# Patient Record
Sex: Female | Born: 1955 | Race: White | Hispanic: No | Marital: Married | Smoking: Never smoker
Health system: Southern US, Community
[De-identification: ages and names within clinical notes are randomized; demographics above are authoritative.]

## PROBLEM LIST (undated history)

## (undated) HISTORY — PX: BREAST EXCISIONAL BIOPSY: SUR124

---

## 2010-03-20 ENCOUNTER — Other Ambulatory Visit: Payer: Self-pay | Admitting: Family Medicine

## 2010-03-20 DIAGNOSIS — Z1231 Encounter for screening mammogram for malignant neoplasm of breast: Secondary | ICD-10-CM

## 2010-03-29 ENCOUNTER — Other Ambulatory Visit (HOSPITAL_COMMUNITY)
Admission: RE | Admit: 2010-03-29 | Discharge: 2010-03-29 | Disposition: A | Discharge status: Home / Self Care | Payer: Managed Care, Other (non HMO) | Source: Ambulatory Visit | Attending: Obstetrics and Gynecology | Admitting: Obstetrics and Gynecology

## 2010-03-29 DIAGNOSIS — Z01419 Encounter for gynecological examination (general) (routine) without abnormal findings: Secondary | ICD-10-CM | POA: Insufficient documentation

## 2010-06-05 ENCOUNTER — Ambulatory Visit
Admission: RE | Admit: 2010-06-05 | Discharge: 2010-06-05 | Disposition: A | Discharge status: Home / Self Care | Payer: Managed Care, Other (non HMO) | Source: Ambulatory Visit | Attending: Family Medicine | Admitting: Family Medicine

## 2010-06-05 DIAGNOSIS — Z1231 Encounter for screening mammogram for malignant neoplasm of breast: Secondary | ICD-10-CM

## 2011-03-11 ENCOUNTER — Other Ambulatory Visit: Payer: Self-pay | Admitting: Family Medicine

## 2011-03-11 DIAGNOSIS — Z1231 Encounter for screening mammogram for malignant neoplasm of breast: Secondary | ICD-10-CM

## 2011-04-03 ENCOUNTER — Other Ambulatory Visit (HOSPITAL_COMMUNITY)
Admission: RE | Admit: 2011-04-03 | Discharge: 2011-04-03 | Disposition: A | Discharge status: Home / Self Care | Payer: Managed Care, Other (non HMO) | Source: Ambulatory Visit | Attending: Obstetrics and Gynecology | Admitting: Obstetrics and Gynecology

## 2011-04-03 DIAGNOSIS — Z01419 Encounter for gynecological examination (general) (routine) without abnormal findings: Secondary | ICD-10-CM | POA: Insufficient documentation

## 2011-04-03 DIAGNOSIS — Z1159 Encounter for screening for other viral diseases: Secondary | ICD-10-CM | POA: Insufficient documentation

## 2011-06-06 ENCOUNTER — Ambulatory Visit
Admission: RE | Admit: 2011-06-06 | Discharge: 2011-06-06 | Disposition: A | Discharge status: Home / Self Care | Payer: Managed Care, Other (non HMO) | Source: Ambulatory Visit | Attending: Family Medicine | Admitting: Family Medicine

## 2011-06-06 DIAGNOSIS — Z1231 Encounter for screening mammogram for malignant neoplasm of breast: Secondary | ICD-10-CM

## 2012-03-31 ENCOUNTER — Other Ambulatory Visit: Payer: Self-pay | Admitting: Family Medicine

## 2012-03-31 DIAGNOSIS — Z1231 Encounter for screening mammogram for malignant neoplasm of breast: Secondary | ICD-10-CM

## 2012-04-07 ENCOUNTER — Other Ambulatory Visit (HOSPITAL_COMMUNITY)
Admission: RE | Admit: 2012-04-07 | Discharge: 2012-04-07 | Disposition: A | Discharge status: Home / Self Care | Payer: Managed Care, Other (non HMO) | Source: Ambulatory Visit | Attending: Obstetrics and Gynecology | Admitting: Obstetrics and Gynecology

## 2012-04-07 ENCOUNTER — Other Ambulatory Visit: Payer: Self-pay | Admitting: Nurse Practitioner

## 2012-04-07 DIAGNOSIS — Z01419 Encounter for gynecological examination (general) (routine) without abnormal findings: Secondary | ICD-10-CM | POA: Insufficient documentation

## 2012-04-07 DIAGNOSIS — Z1151 Encounter for screening for human papillomavirus (HPV): Secondary | ICD-10-CM | POA: Insufficient documentation

## 2012-06-08 ENCOUNTER — Ambulatory Visit
Admission: RE | Admit: 2012-06-08 | Discharge: 2012-06-08 | Disposition: A | Discharge status: Home / Self Care | Payer: Managed Care, Other (non HMO) | Source: Ambulatory Visit | Attending: Family Medicine | Admitting: Family Medicine

## 2012-06-08 DIAGNOSIS — Z1231 Encounter for screening mammogram for malignant neoplasm of breast: Secondary | ICD-10-CM

## 2013-04-13 ENCOUNTER — Other Ambulatory Visit (HOSPITAL_COMMUNITY)
Admission: RE | Admit: 2013-04-13 | Discharge: 2013-04-13 | Disposition: A | Discharge status: Home / Self Care | Payer: BC Managed Care – PPO | Source: Ambulatory Visit | Attending: Obstetrics and Gynecology | Admitting: Obstetrics and Gynecology

## 2013-04-13 ENCOUNTER — Other Ambulatory Visit: Payer: Self-pay | Admitting: Nurse Practitioner

## 2013-04-13 DIAGNOSIS — Z01419 Encounter for gynecological examination (general) (routine) without abnormal findings: Secondary | ICD-10-CM | POA: Insufficient documentation

## 2013-04-19 ENCOUNTER — Other Ambulatory Visit: Payer: Self-pay

## 2013-04-19 DIAGNOSIS — Z1231 Encounter for screening mammogram for malignant neoplasm of breast: Secondary | ICD-10-CM

## 2013-06-10 ENCOUNTER — Encounter (INDEPENDENT_AMBULATORY_CARE_PROVIDER_SITE_OTHER): Payer: Self-pay

## 2013-06-10 ENCOUNTER — Ambulatory Visit
Admission: RE | Admit: 2013-06-10 | Discharge: 2013-06-10 | Disposition: A | Discharge status: Home / Self Care | Payer: BC Managed Care – PPO | Source: Ambulatory Visit

## 2013-06-10 DIAGNOSIS — Z1231 Encounter for screening mammogram for malignant neoplasm of breast: Secondary | ICD-10-CM

## 2014-04-19 ENCOUNTER — Other Ambulatory Visit (HOSPITAL_COMMUNITY)
Admission: RE | Admit: 2014-04-19 | Discharge: 2014-04-19 | Disposition: A | Discharge status: Home / Self Care | Payer: BLUE CROSS/BLUE SHIELD | Source: Ambulatory Visit | Attending: Obstetrics and Gynecology | Admitting: Obstetrics and Gynecology

## 2014-04-19 ENCOUNTER — Other Ambulatory Visit: Payer: Self-pay | Admitting: Nurse Practitioner

## 2014-04-19 DIAGNOSIS — Z01419 Encounter for gynecological examination (general) (routine) without abnormal findings: Secondary | ICD-10-CM | POA: Diagnosis not present

## 2014-04-20 ENCOUNTER — Other Ambulatory Visit: Payer: Self-pay

## 2014-04-20 DIAGNOSIS — Z1231 Encounter for screening mammogram for malignant neoplasm of breast: Secondary | ICD-10-CM

## 2014-04-20 LAB — CYTOLOGY - PAP

## 2014-06-13 ENCOUNTER — Ambulatory Visit: Payer: Self-pay

## 2014-07-04 ENCOUNTER — Ambulatory Visit
Admission: RE | Admit: 2014-07-04 | Discharge: 2014-07-04 | Disposition: A | Discharge status: Home / Self Care | Payer: BLUE CROSS/BLUE SHIELD | Source: Ambulatory Visit

## 2014-07-04 DIAGNOSIS — Z1231 Encounter for screening mammogram for malignant neoplasm of breast: Secondary | ICD-10-CM

## 2015-04-20 ENCOUNTER — Other Ambulatory Visit (HOSPITAL_COMMUNITY)
Admission: RE | Admit: 2015-04-20 | Discharge: 2015-04-20 | Disposition: A | Discharge status: Home / Self Care | Payer: BLUE CROSS/BLUE SHIELD | Source: Ambulatory Visit | Attending: Nurse Practitioner | Admitting: Nurse Practitioner

## 2015-04-20 ENCOUNTER — Other Ambulatory Visit: Payer: Self-pay | Admitting: Nurse Practitioner

## 2015-04-20 DIAGNOSIS — Z1151 Encounter for screening for human papillomavirus (HPV): Secondary | ICD-10-CM | POA: Insufficient documentation

## 2015-04-20 DIAGNOSIS — Z01419 Encounter for gynecological examination (general) (routine) without abnormal findings: Secondary | ICD-10-CM | POA: Insufficient documentation

## 2015-04-21 LAB — CYTOLOGY - PAP

## 2015-04-24 ENCOUNTER — Other Ambulatory Visit: Payer: Self-pay

## 2015-04-24 DIAGNOSIS — Z1231 Encounter for screening mammogram for malignant neoplasm of breast: Secondary | ICD-10-CM

## 2015-07-05 ENCOUNTER — Ambulatory Visit
Admission: RE | Admit: 2015-07-05 | Discharge: 2015-07-05 | Disposition: A | Discharge status: Home / Self Care | Payer: BLUE CROSS/BLUE SHIELD | Source: Ambulatory Visit

## 2015-07-05 DIAGNOSIS — Z1231 Encounter for screening mammogram for malignant neoplasm of breast: Secondary | ICD-10-CM | POA: Diagnosis not present

## 2015-08-02 DIAGNOSIS — R51 Headache: Secondary | ICD-10-CM | POA: Diagnosis not present

## 2015-08-02 DIAGNOSIS — E039 Hypothyroidism, unspecified: Secondary | ICD-10-CM | POA: Diagnosis not present

## 2015-08-02 DIAGNOSIS — R42 Dizziness and giddiness: Secondary | ICD-10-CM | POA: Diagnosis not present

## 2015-09-08 DIAGNOSIS — R42 Dizziness and giddiness: Secondary | ICD-10-CM | POA: Diagnosis not present

## 2015-09-08 DIAGNOSIS — K449 Diaphragmatic hernia without obstruction or gangrene: Secondary | ICD-10-CM | POA: Diagnosis not present

## 2015-09-08 DIAGNOSIS — R6889 Other general symptoms and signs: Secondary | ICD-10-CM | POA: Diagnosis not present

## 2015-10-04 DIAGNOSIS — L578 Other skin changes due to chronic exposure to nonionizing radiation: Secondary | ICD-10-CM | POA: Diagnosis not present

## 2015-10-04 DIAGNOSIS — L111 Transient acantholytic dermatosis [Grover]: Secondary | ICD-10-CM | POA: Diagnosis not present

## 2015-10-04 DIAGNOSIS — L570 Actinic keratosis: Secondary | ICD-10-CM | POA: Diagnosis not present

## 2015-10-04 DIAGNOSIS — L821 Other seborrheic keratosis: Secondary | ICD-10-CM | POA: Diagnosis not present

## 2015-10-04 DIAGNOSIS — L738 Other specified follicular disorders: Secondary | ICD-10-CM | POA: Diagnosis not present

## 2015-10-12 DIAGNOSIS — E039 Hypothyroidism, unspecified: Secondary | ICD-10-CM | POA: Diagnosis not present

## 2015-11-16 DIAGNOSIS — Z23 Encounter for immunization: Secondary | ICD-10-CM | POA: Diagnosis not present

## 2015-11-22 DIAGNOSIS — B029 Zoster without complications: Secondary | ICD-10-CM | POA: Diagnosis not present

## 2015-11-24 DIAGNOSIS — H5711 Ocular pain, right eye: Secondary | ICD-10-CM | POA: Diagnosis not present

## 2016-01-11 DIAGNOSIS — R8290 Unspecified abnormal findings in urine: Secondary | ICD-10-CM | POA: Diagnosis not present

## 2016-01-11 DIAGNOSIS — Z0000 Encounter for general adult medical examination without abnormal findings: Secondary | ICD-10-CM | POA: Diagnosis not present

## 2016-02-15 DIAGNOSIS — H25813 Combined forms of age-related cataract, bilateral: Secondary | ICD-10-CM | POA: Diagnosis not present

## 2016-04-10 ENCOUNTER — Other Ambulatory Visit: Payer: Self-pay | Admitting: Family Medicine

## 2016-04-10 DIAGNOSIS — Z1231 Encounter for screening mammogram for malignant neoplasm of breast: Secondary | ICD-10-CM

## 2016-04-23 DIAGNOSIS — Z01419 Encounter for gynecological examination (general) (routine) without abnormal findings: Secondary | ICD-10-CM | POA: Diagnosis not present

## 2016-05-29 DIAGNOSIS — R3915 Urgency of urination: Secondary | ICD-10-CM | POA: Diagnosis not present

## 2016-07-05 ENCOUNTER — Ambulatory Visit
Admission: RE | Admit: 2016-07-05 | Discharge: 2016-07-05 | Disposition: A | Discharge status: Home / Self Care | Payer: BLUE CROSS/BLUE SHIELD | Source: Ambulatory Visit | Attending: Family Medicine | Admitting: Family Medicine

## 2016-07-05 DIAGNOSIS — Z1231 Encounter for screening mammogram for malignant neoplasm of breast: Secondary | ICD-10-CM

## 2016-10-03 DIAGNOSIS — L578 Other skin changes due to chronic exposure to nonionizing radiation: Secondary | ICD-10-CM | POA: Diagnosis not present

## 2016-10-03 DIAGNOSIS — I788 Other diseases of capillaries: Secondary | ICD-10-CM | POA: Diagnosis not present

## 2016-10-03 DIAGNOSIS — L821 Other seborrheic keratosis: Secondary | ICD-10-CM | POA: Diagnosis not present

## 2016-10-03 DIAGNOSIS — D2239 Melanocytic nevi of other parts of face: Secondary | ICD-10-CM | POA: Diagnosis not present

## 2016-11-07 DIAGNOSIS — M5412 Radiculopathy, cervical region: Secondary | ICD-10-CM | POA: Diagnosis not present

## 2016-11-07 DIAGNOSIS — Z23 Encounter for immunization: Secondary | ICD-10-CM | POA: Diagnosis not present

## 2017-01-15 DIAGNOSIS — E039 Hypothyroidism, unspecified: Secondary | ICD-10-CM | POA: Diagnosis not present

## 2017-01-15 DIAGNOSIS — Z0000 Encounter for general adult medical examination without abnormal findings: Secondary | ICD-10-CM | POA: Diagnosis not present

## 2017-03-06 ENCOUNTER — Other Ambulatory Visit: Payer: Self-pay | Admitting: Family Medicine

## 2017-03-06 DIAGNOSIS — Z1231 Encounter for screening mammogram for malignant neoplasm of breast: Secondary | ICD-10-CM

## 2017-04-24 ENCOUNTER — Other Ambulatory Visit: Payer: Self-pay | Admitting: Nurse Practitioner

## 2017-04-24 ENCOUNTER — Other Ambulatory Visit (HOSPITAL_COMMUNITY)
Admission: RE | Admit: 2017-04-24 | Discharge: 2017-04-24 | Disposition: A | Discharge status: Home / Self Care | Payer: BLUE CROSS/BLUE SHIELD | Source: Ambulatory Visit | Attending: Nurse Practitioner | Admitting: Nurse Practitioner

## 2017-04-24 DIAGNOSIS — Z01419 Encounter for gynecological examination (general) (routine) without abnormal findings: Secondary | ICD-10-CM | POA: Insufficient documentation

## 2017-04-25 LAB — CYTOLOGY - PAP
Diagnosis: NEGATIVE
HPV: NOT DETECTED

## 2017-06-11 DIAGNOSIS — H04123 Dry eye syndrome of bilateral lacrimal glands: Secondary | ICD-10-CM | POA: Diagnosis not present

## 2017-06-20 DIAGNOSIS — L308 Other specified dermatitis: Secondary | ICD-10-CM | POA: Diagnosis not present

## 2017-07-08 ENCOUNTER — Ambulatory Visit
Admission: RE | Admit: 2017-07-08 | Discharge: 2017-07-08 | Disposition: A | Discharge status: Home / Self Care | Payer: BLUE CROSS/BLUE SHIELD | Source: Ambulatory Visit | Attending: Family Medicine | Admitting: Family Medicine

## 2017-07-08 DIAGNOSIS — Z1231 Encounter for screening mammogram for malignant neoplasm of breast: Secondary | ICD-10-CM

## 2017-07-17 DIAGNOSIS — E039 Hypothyroidism, unspecified: Secondary | ICD-10-CM | POA: Diagnosis not present

## 2017-10-02 DIAGNOSIS — K1379 Other lesions of oral mucosa: Secondary | ICD-10-CM | POA: Diagnosis not present

## 2017-12-05 DIAGNOSIS — Z23 Encounter for immunization: Secondary | ICD-10-CM | POA: Diagnosis not present

## 2017-12-25 DIAGNOSIS — D225 Melanocytic nevi of trunk: Secondary | ICD-10-CM | POA: Diagnosis not present

## 2017-12-25 DIAGNOSIS — L570 Actinic keratosis: Secondary | ICD-10-CM | POA: Diagnosis not present

## 2017-12-25 DIAGNOSIS — L821 Other seborrheic keratosis: Secondary | ICD-10-CM | POA: Diagnosis not present

## 2017-12-25 DIAGNOSIS — L738 Other specified follicular disorders: Secondary | ICD-10-CM | POA: Diagnosis not present

## 2017-12-25 DIAGNOSIS — L218 Other seborrheic dermatitis: Secondary | ICD-10-CM | POA: Diagnosis not present

## 2017-12-25 DIAGNOSIS — L814 Other melanin hyperpigmentation: Secondary | ICD-10-CM | POA: Diagnosis not present

## 2018-02-18 DIAGNOSIS — Z79899 Other long term (current) drug therapy: Secondary | ICD-10-CM | POA: Diagnosis not present

## 2018-02-18 DIAGNOSIS — Z0000 Encounter for general adult medical examination without abnormal findings: Secondary | ICD-10-CM | POA: Diagnosis not present

## 2018-02-18 DIAGNOSIS — E039 Hypothyroidism, unspecified: Secondary | ICD-10-CM | POA: Diagnosis not present

## 2018-02-18 DIAGNOSIS — Z13220 Encounter for screening for lipoid disorders: Secondary | ICD-10-CM | POA: Diagnosis not present

## 2018-02-18 DIAGNOSIS — N3281 Overactive bladder: Secondary | ICD-10-CM | POA: Diagnosis not present

## 2018-03-09 ENCOUNTER — Other Ambulatory Visit: Payer: Self-pay | Admitting: Family Medicine

## 2018-03-09 DIAGNOSIS — Z1231 Encounter for screening mammogram for malignant neoplasm of breast: Secondary | ICD-10-CM

## 2018-03-13 DIAGNOSIS — M79604 Pain in right leg: Secondary | ICD-10-CM | POA: Diagnosis not present

## 2018-03-16 ENCOUNTER — Other Ambulatory Visit (HOSPITAL_BASED_OUTPATIENT_CLINIC_OR_DEPARTMENT_OTHER): Payer: Self-pay | Admitting: Family Medicine

## 2018-03-16 DIAGNOSIS — M79604 Pain in right leg: Secondary | ICD-10-CM

## 2018-03-17 ENCOUNTER — Ambulatory Visit (HOSPITAL_BASED_OUTPATIENT_CLINIC_OR_DEPARTMENT_OTHER)
Admission: RE | Admit: 2018-03-17 | Discharge: 2018-03-17 | Disposition: A | Discharge status: Home / Self Care | Payer: BLUE CROSS/BLUE SHIELD | Source: Ambulatory Visit | Attending: Family Medicine | Admitting: Family Medicine

## 2018-03-17 DIAGNOSIS — M7989 Other specified soft tissue disorders: Secondary | ICD-10-CM | POA: Diagnosis not present

## 2018-03-17 DIAGNOSIS — M79604 Pain in right leg: Secondary | ICD-10-CM | POA: Diagnosis not present

## 2018-03-22 DIAGNOSIS — R05 Cough: Secondary | ICD-10-CM | POA: Diagnosis not present

## 2018-03-22 DIAGNOSIS — J101 Influenza due to other identified influenza virus with other respiratory manifestations: Secondary | ICD-10-CM | POA: Diagnosis not present

## 2018-04-09 DIAGNOSIS — M2550 Pain in unspecified joint: Secondary | ICD-10-CM | POA: Diagnosis not present

## 2018-04-14 DIAGNOSIS — M222X1 Patellofemoral disorders, right knee: Secondary | ICD-10-CM | POA: Diagnosis not present

## 2018-04-14 DIAGNOSIS — M25361 Other instability, right knee: Secondary | ICD-10-CM | POA: Diagnosis not present

## 2018-04-14 DIAGNOSIS — M25562 Pain in left knee: Secondary | ICD-10-CM | POA: Diagnosis not present

## 2018-04-14 DIAGNOSIS — M25362 Other instability, left knee: Secondary | ICD-10-CM | POA: Diagnosis not present

## 2018-04-14 DIAGNOSIS — M25561 Pain in right knee: Secondary | ICD-10-CM | POA: Diagnosis not present

## 2018-07-13 ENCOUNTER — Ambulatory Visit: Payer: BLUE CROSS/BLUE SHIELD

## 2018-08-27 ENCOUNTER — Other Ambulatory Visit (HOSPITAL_COMMUNITY)
Admission: RE | Admit: 2018-08-27 | Discharge: 2018-08-27 | Disposition: A | Discharge status: Home / Self Care | Payer: BC Managed Care – PPO | Source: Ambulatory Visit | Attending: Nurse Practitioner | Admitting: Nurse Practitioner

## 2018-08-27 ENCOUNTER — Other Ambulatory Visit: Payer: Self-pay | Admitting: Nurse Practitioner

## 2018-08-27 DIAGNOSIS — Z13820 Encounter for screening for osteoporosis: Secondary | ICD-10-CM | POA: Diagnosis not present

## 2018-08-27 DIAGNOSIS — Z01419 Encounter for gynecological examination (general) (routine) without abnormal findings: Secondary | ICD-10-CM | POA: Insufficient documentation

## 2018-08-27 DIAGNOSIS — R232 Flushing: Secondary | ICD-10-CM | POA: Diagnosis not present

## 2018-08-28 DIAGNOSIS — D1801 Hemangioma of skin and subcutaneous tissue: Secondary | ICD-10-CM | POA: Diagnosis not present

## 2018-08-28 DIAGNOSIS — L700 Acne vulgaris: Secondary | ICD-10-CM | POA: Diagnosis not present

## 2018-08-31 ENCOUNTER — Other Ambulatory Visit: Payer: Self-pay | Admitting: Nurse Practitioner

## 2018-09-01 LAB — CYTOLOGY - PAP
Diagnosis: NEGATIVE
HPV: NOT DETECTED

## 2018-09-02 DIAGNOSIS — M7989 Other specified soft tissue disorders: Secondary | ICD-10-CM | POA: Diagnosis not present

## 2018-09-03 ENCOUNTER — Other Ambulatory Visit: Payer: Self-pay | Admitting: Family Medicine

## 2018-09-03 DIAGNOSIS — M7989 Other specified soft tissue disorders: Secondary | ICD-10-CM

## 2018-09-05 ENCOUNTER — Ambulatory Visit
Admission: RE | Admit: 2018-09-05 | Discharge: 2018-09-05 | Disposition: A | Discharge status: Home / Self Care | Payer: BC Managed Care – PPO | Source: Ambulatory Visit | Attending: Family Medicine | Admitting: Family Medicine

## 2018-09-05 ENCOUNTER — Other Ambulatory Visit: Payer: Self-pay

## 2018-09-05 DIAGNOSIS — M7989 Other specified soft tissue disorders: Secondary | ICD-10-CM | POA: Diagnosis not present

## 2018-09-05 MED ORDER — GADOBENATE DIMEGLUMINE 529 MG/ML IV SOLN
15.0000 mL | Freq: Once | INTRAVENOUS | Status: AC | PRN
  Administered 2018-09-05: 15 mL via INTRAVENOUS

## 2018-09-07 ENCOUNTER — Other Ambulatory Visit: Payer: Self-pay

## 2018-09-07 ENCOUNTER — Ambulatory Visit
Admission: RE | Admit: 2018-09-07 | Discharge: 2018-09-07 | Disposition: A | Discharge status: Home / Self Care | Payer: BC Managed Care – PPO | Source: Ambulatory Visit | Attending: Family Medicine | Admitting: Family Medicine

## 2018-09-07 DIAGNOSIS — Z1231 Encounter for screening mammogram for malignant neoplasm of breast: Secondary | ICD-10-CM

## 2018-10-20 DIAGNOSIS — Z01818 Encounter for other preprocedural examination: Secondary | ICD-10-CM | POA: Diagnosis not present

## 2018-11-03 DIAGNOSIS — L570 Actinic keratosis: Secondary | ICD-10-CM | POA: Diagnosis not present

## 2018-12-04 DIAGNOSIS — Z1159 Encounter for screening for other viral diseases: Secondary | ICD-10-CM | POA: Diagnosis not present

## 2018-12-09 DIAGNOSIS — D124 Benign neoplasm of descending colon: Secondary | ICD-10-CM | POA: Diagnosis not present

## 2018-12-09 DIAGNOSIS — Z1211 Encounter for screening for malignant neoplasm of colon: Secondary | ICD-10-CM | POA: Diagnosis not present

## 2018-12-24 DIAGNOSIS — D485 Neoplasm of uncertain behavior of skin: Secondary | ICD-10-CM | POA: Diagnosis not present

## 2018-12-24 DIAGNOSIS — L719 Rosacea, unspecified: Secondary | ICD-10-CM | POA: Diagnosis not present

## 2019-01-21 DIAGNOSIS — L821 Other seborrheic keratosis: Secondary | ICD-10-CM | POA: Diagnosis not present

## 2019-01-21 DIAGNOSIS — L111 Transient acantholytic dermatosis [Grover]: Secondary | ICD-10-CM | POA: Diagnosis not present

## 2019-01-21 DIAGNOSIS — D1801 Hemangioma of skin and subcutaneous tissue: Secondary | ICD-10-CM | POA: Diagnosis not present

## 2019-01-21 DIAGNOSIS — L718 Other rosacea: Secondary | ICD-10-CM | POA: Diagnosis not present

## 2019-02-25 DIAGNOSIS — Z23 Encounter for immunization: Secondary | ICD-10-CM | POA: Diagnosis not present

## 2019-02-25 DIAGNOSIS — Z0000 Encounter for general adult medical examination without abnormal findings: Secondary | ICD-10-CM | POA: Diagnosis not present

## 2019-02-25 DIAGNOSIS — E039 Hypothyroidism, unspecified: Secondary | ICD-10-CM | POA: Diagnosis not present

## 2019-02-25 DIAGNOSIS — E7800 Pure hypercholesterolemia, unspecified: Secondary | ICD-10-CM | POA: Diagnosis not present

## 2019-03-08 ENCOUNTER — Other Ambulatory Visit: Payer: Self-pay | Admitting: Family Medicine

## 2019-03-08 DIAGNOSIS — E2839 Other primary ovarian failure: Secondary | ICD-10-CM

## 2019-04-08 ENCOUNTER — Other Ambulatory Visit: Payer: Self-pay | Admitting: Family Medicine

## 2019-04-08 DIAGNOSIS — Z1231 Encounter for screening mammogram for malignant neoplasm of breast: Secondary | ICD-10-CM

## 2019-04-23 DIAGNOSIS — N8110 Cystocele, unspecified: Secondary | ICD-10-CM | POA: Diagnosis not present

## 2019-04-26 DIAGNOSIS — E039 Hypothyroidism, unspecified: Secondary | ICD-10-CM | POA: Diagnosis not present

## 2019-05-17 ENCOUNTER — Other Ambulatory Visit: Payer: BC Managed Care – PPO

## 2019-06-19 DIAGNOSIS — R197 Diarrhea, unspecified: Secondary | ICD-10-CM | POA: Diagnosis not present

## 2019-07-01 ENCOUNTER — Ambulatory Visit
Admission: RE | Admit: 2019-07-01 | Discharge: 2019-07-01 | Disposition: A | Discharge status: Home / Self Care | Payer: BC Managed Care – PPO | Source: Ambulatory Visit | Attending: Family Medicine | Admitting: Family Medicine

## 2019-07-01 ENCOUNTER — Other Ambulatory Visit: Payer: Self-pay

## 2019-07-01 DIAGNOSIS — Z780 Asymptomatic menopausal state: Secondary | ICD-10-CM | POA: Diagnosis not present

## 2019-07-01 DIAGNOSIS — M8588 Other specified disorders of bone density and structure, other site: Secondary | ICD-10-CM | POA: Diagnosis not present

## 2019-07-01 DIAGNOSIS — E2839 Other primary ovarian failure: Secondary | ICD-10-CM

## 2019-07-07 DIAGNOSIS — M8580 Other specified disorders of bone density and structure, unspecified site: Secondary | ICD-10-CM | POA: Diagnosis not present

## 2019-08-05 DIAGNOSIS — M8580 Other specified disorders of bone density and structure, unspecified site: Secondary | ICD-10-CM | POA: Diagnosis not present

## 2019-08-31 DIAGNOSIS — N814 Uterovaginal prolapse, unspecified: Secondary | ICD-10-CM | POA: Diagnosis not present

## 2019-08-31 DIAGNOSIS — R232 Flushing: Secondary | ICD-10-CM | POA: Diagnosis not present

## 2019-08-31 DIAGNOSIS — Z01419 Encounter for gynecological examination (general) (routine) without abnormal findings: Secondary | ICD-10-CM | POA: Diagnosis not present

## 2019-09-08 ENCOUNTER — Ambulatory Visit
Admission: RE | Admit: 2019-09-08 | Discharge: 2019-09-08 | Disposition: A | Discharge status: Home / Self Care | Payer: BC Managed Care – PPO | Source: Ambulatory Visit | Attending: Family Medicine | Admitting: Family Medicine

## 2019-09-08 DIAGNOSIS — Z1231 Encounter for screening mammogram for malignant neoplasm of breast: Secondary | ICD-10-CM | POA: Diagnosis not present

## 2020-01-13 DIAGNOSIS — H524 Presbyopia: Secondary | ICD-10-CM | POA: Diagnosis not present

## 2020-01-13 DIAGNOSIS — H2513 Age-related nuclear cataract, bilateral: Secondary | ICD-10-CM | POA: Diagnosis not present

## 2020-01-25 DIAGNOSIS — L814 Other melanin hyperpigmentation: Secondary | ICD-10-CM | POA: Diagnosis not present

## 2020-01-25 DIAGNOSIS — L821 Other seborrheic keratosis: Secondary | ICD-10-CM | POA: Diagnosis not present

## 2020-01-25 DIAGNOSIS — L570 Actinic keratosis: Secondary | ICD-10-CM | POA: Diagnosis not present

## 2020-01-25 DIAGNOSIS — D1801 Hemangioma of skin and subcutaneous tissue: Secondary | ICD-10-CM | POA: Diagnosis not present

## 2020-01-25 DIAGNOSIS — I788 Other diseases of capillaries: Secondary | ICD-10-CM | POA: Diagnosis not present

## 2020-03-30 DIAGNOSIS — Z0000 Encounter for general adult medical examination without abnormal findings: Secondary | ICD-10-CM | POA: Diagnosis not present

## 2020-03-30 DIAGNOSIS — N3281 Overactive bladder: Secondary | ICD-10-CM | POA: Diagnosis not present

## 2020-03-30 DIAGNOSIS — E039 Hypothyroidism, unspecified: Secondary | ICD-10-CM | POA: Diagnosis not present

## 2020-03-30 DIAGNOSIS — E7800 Pure hypercholesterolemia, unspecified: Secondary | ICD-10-CM | POA: Diagnosis not present

## 2020-03-30 DIAGNOSIS — K219 Gastro-esophageal reflux disease without esophagitis: Secondary | ICD-10-CM | POA: Diagnosis not present

## 2020-03-30 DIAGNOSIS — Z79899 Other long term (current) drug therapy: Secondary | ICD-10-CM | POA: Diagnosis not present

## 2020-03-30 DIAGNOSIS — M8588 Other specified disorders of bone density and structure, other site: Secondary | ICD-10-CM | POA: Diagnosis not present

## 2020-04-16 IMAGING — MR MRI CHEST WITHOUT AND WITH CONTRAST
14 series · 16 of 16 positions shown · IV contrast (multihance)
Comparison: Radiographs dated 09/02/2018

CLINICAL DATA: Left supraclavicular soft tissue mass.

EXAM:
MRI CHEST WITHOUT AND WITH CONTRAST
TECHNIQUE: Multiplanar multi echo sequences were performed before after
contrast administration.
CONTRAST:  15mL MULTIHANCE GADOBENATE DIMEGLUMINE 529 MG/ML IV SOLN

[Series 4: T1 · axial · 3.0mm · 1.06mm/px · z∈[-48,+87]mm · 2 of 35 slices shown (1 of 4)]
[im 1/35]
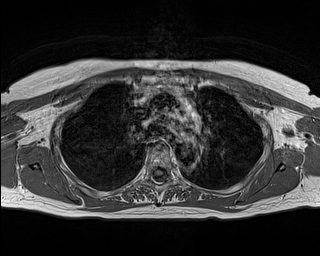
[im 35/35]
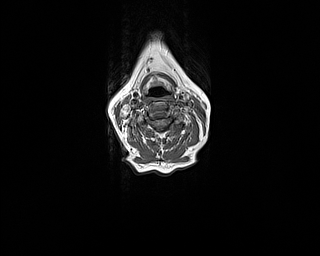

[Series 5: T2 fat-sat · axial · 3.0mm · 0.70mm/px · z∈[-49,+87]mm · 2 of 35 slices shown (1 of 4)]
[im 1/35]
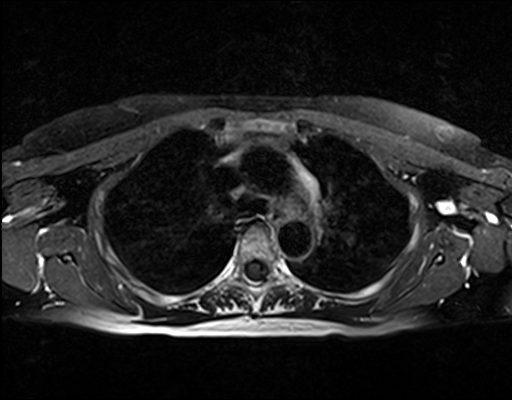
[im 35/35]
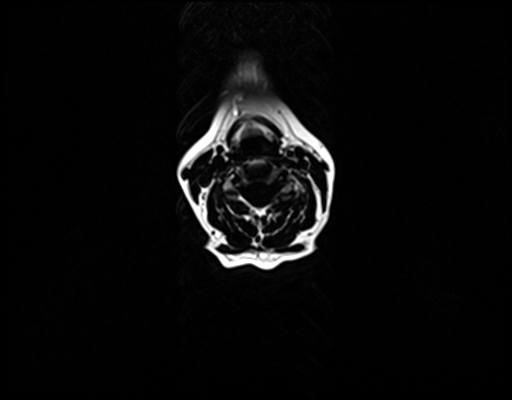

[Series 6: T1 · axial · 4.0mm · 0.69mm/px · 1 of 30 slices shown (2 of 4)]
[im 1/30]
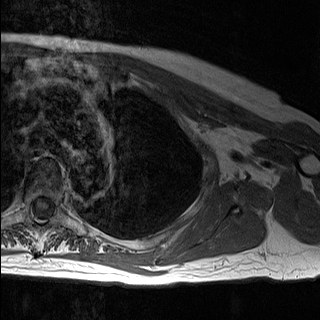

[Series 7: pre fs axial · axial · non-contrast · 4.0mm · 0.86mm/px · 1 of 30 slices shown]
[im 1/30]
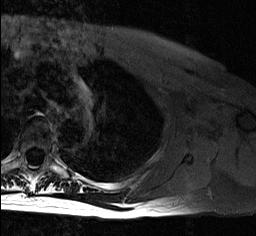

[Series 8: T2 fat-sat · axial · 4.0mm · 0.86mm/px · 1 of 30 slices shown (2 of 4)]
[im 1/30]
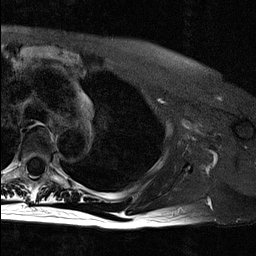

[Series 9: T1 · coronal · 4.5mm · 0.69mm/px · 1 of 34 slices shown (3 of 4)]
[im 1/34]
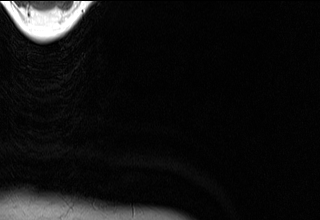

[Series 10: T2 fat-sat · coronal · 4.5mm · 0.86mm/px · 1 of 34 slices shown (3 of 4)]
[im 1/34]
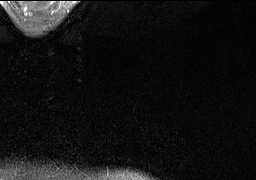

[Series 11: T1 · sagittal · 5.0mm · 0.69mm/px · 1 of 33 slices shown (4 of 4)]
[im 1/33]
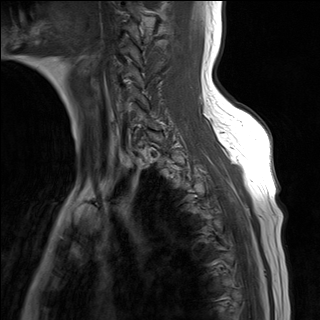

[Series 12: T2 fat-sat · sagittal · 5.0mm · 0.86mm/px · 1 of 33 slices shown (4 of 4)]
[im 1/33]
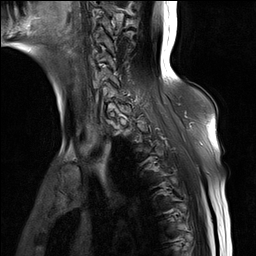

[Series 13: STIR · coronal · 4.5mm · 0.86mm/px · 1 of 34 slices shown]
[im 1/34]
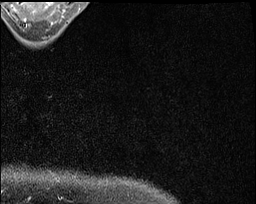

[Series 14: post fs axial · axial · 4.0mm · 0.86mm/px · 1 of 30 slices shown (1 of 2)]
[im 1/30]
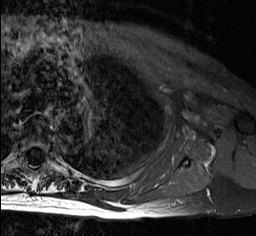

[Series 15: post fs cor · coronal · 4.5mm · 0.86mm/px · 1 of 34 slices shown]
[im 1/34]
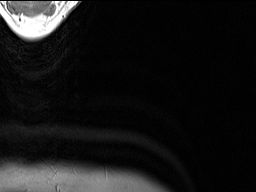

[Series 17: post fs axial · axial · 4.0mm · 0.86mm/px · 1 of 30 slices shown (2 of 2)]
[im 1/30]
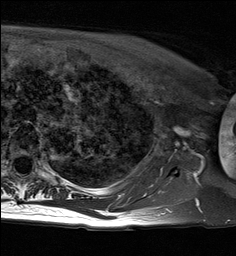

[Series 18: post fs sag · sagittal · 5.0mm · 0.86mm/px · 1 of 33 slices shown]
[im 1/33]
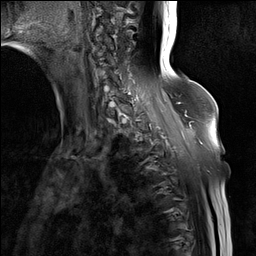

[16 of 16 positions shown; findings below may reference images not displayed]

FINDINGS: Patient has prominent but otherwise normal appearing subcutaneous
fat in the left supraclavicular region as well as in the midline of
the upper back overlying the lower cervical and upper thoracic
spine. The underlying trapezius muscles and the muscles of the
rotator cuff appear normal bilaterally. There is no pathologic
enhancement after contrast administration. No adenopathy.
IMPRESSION: Prominent but otherwise normal subcutaneous fat in the area of
concern. No worrisome findings.

## 2020-04-27 ENCOUNTER — Other Ambulatory Visit: Payer: Self-pay | Admitting: Family Medicine

## 2020-04-27 DIAGNOSIS — Z1231 Encounter for screening mammogram for malignant neoplasm of breast: Secondary | ICD-10-CM

## 2020-08-15 DIAGNOSIS — H2513 Age-related nuclear cataract, bilateral: Secondary | ICD-10-CM | POA: Diagnosis not present

## 2020-08-15 DIAGNOSIS — H5203 Hypermetropia, bilateral: Secondary | ICD-10-CM | POA: Diagnosis not present

## 2020-08-23 DIAGNOSIS — N8110 Cystocele, unspecified: Secondary | ICD-10-CM | POA: Diagnosis not present

## 2020-08-23 DIAGNOSIS — N3281 Overactive bladder: Secondary | ICD-10-CM | POA: Diagnosis not present

## 2020-08-23 DIAGNOSIS — N816 Rectocele: Secondary | ICD-10-CM | POA: Diagnosis not present

## 2020-08-28 DIAGNOSIS — M1811 Unilateral primary osteoarthritis of first carpometacarpal joint, right hand: Secondary | ICD-10-CM | POA: Diagnosis not present

## 2020-09-07 DIAGNOSIS — M1811 Unilateral primary osteoarthritis of first carpometacarpal joint, right hand: Secondary | ICD-10-CM | POA: Diagnosis not present

## 2020-09-07 DIAGNOSIS — M25541 Pain in joints of right hand: Secondary | ICD-10-CM | POA: Diagnosis not present

## 2020-09-07 DIAGNOSIS — M25542 Pain in joints of left hand: Secondary | ICD-10-CM | POA: Diagnosis not present

## 2020-09-08 ENCOUNTER — Ambulatory Visit: Payer: BC Managed Care – PPO

## 2020-09-10 ENCOUNTER — Emergency Department (HOSPITAL_BASED_OUTPATIENT_CLINIC_OR_DEPARTMENT_OTHER): Payer: Medicare Other | Admitting: Radiology

## 2020-09-10 ENCOUNTER — Encounter (HOSPITAL_BASED_OUTPATIENT_CLINIC_OR_DEPARTMENT_OTHER): Payer: Self-pay | Admitting: Obstetrics and Gynecology

## 2020-09-10 ENCOUNTER — Other Ambulatory Visit: Payer: Self-pay

## 2020-09-10 ENCOUNTER — Emergency Department (HOSPITAL_BASED_OUTPATIENT_CLINIC_OR_DEPARTMENT_OTHER)
Admission: EM | Admit: 2020-09-10 | Discharge: 2020-09-10 | Disposition: A | Discharge status: Home / Self Care | Payer: Medicare Other | Attending: Emergency Medicine | Admitting: Emergency Medicine

## 2020-09-10 DIAGNOSIS — R072 Precordial pain: Secondary | ICD-10-CM | POA: Insufficient documentation

## 2020-09-10 DIAGNOSIS — R079 Chest pain, unspecified: Secondary | ICD-10-CM | POA: Diagnosis not present

## 2020-09-10 DIAGNOSIS — R0789 Other chest pain: Secondary | ICD-10-CM | POA: Diagnosis not present

## 2020-09-10 LAB — BASIC METABOLIC PANEL
Anion gap: 8 (ref 5–15)
BUN: 16 mg/dL (ref 8–23)
CO2: 28 mmol/L (ref 22–32)
Calcium: 10.2 mg/dL (ref 8.9–10.3)
Chloride: 103 mmol/L (ref 98–111)
Creatinine, Ser: 0.81 mg/dL (ref 0.44–1.00)
GFR, Estimated: >60 mL/min (ref >60)
Glucose, Bld: 95 mg/dL (ref 70–99)
Potassium: 4.1 mmol/L (ref 3.5–5.1)
Sodium: 139 mmol/L (ref 135–145)

## 2020-09-10 LAB — TROPONIN I (HIGH SENSITIVITY)
Troponin I (High Sensitivity): 5 ng/L (ref <18)
Troponin I (High Sensitivity): 6 ng/L (ref <18)

## 2020-09-10 LAB — CBC
HCT: 44.4 % (ref 36.0–46.0)
Hemoglobin: 14.5 g/dL (ref 12.0–15.0)
MCH: 28.3 pg (ref 26.0–34.0)
MCHC: 32.7 g/dL (ref 30.0–36.0)
MCV: 86.7 fL (ref 80.0–100.0)
Platelets: 257 10*3/uL (ref 150–400)
RBC: 5.12 MIL/uL — ABNORMAL HIGH (ref 3.87–5.11)
RDW: 13.5 % (ref 11.5–15.5)
WBC: 5.8 10*3/uL (ref 4.0–10.5)
nRBC: 0 % (ref 0.0–0.2)

## 2020-09-10 NOTE — ED Provider Notes (Signed)
[facility] EMERGENCY DEPT Provider Note   CSN: [id number] Arrival date & time: 09/10/20  1235     History Chief Complaint  Patient presents with   Chest Pain    Karla Lucas is a 65 y.o. female.  Pt c/o mid chest pain at rest this AM. States at desk, no stress or exertion, and had acute onset mid chest pain, radiating updwards, dull, moderate, lasting 1 hr +. Pain was not pleuritic. No associated sob or nv. No cough or uri symptoms. Hx hh, no current heartburn. No back or flank pain. No abd pain. Denies leg pain or swelling. No hx dvt or pe. No personal or fam hx cad. No other recent cp or discomfort, even w exertion. No unusual doe or fatigue.   The history is provided by the patient.  Chest Pain Associated symptoms: no abdominal pain, no back pain, no cough, no fever, no headache, no nausea, no palpitations, no shortness of breath and no vomiting       History reviewed. No pertinent past medical history.  There are no problems to display for this patient.   Past Surgical History:  Procedure Laterality Date   BREAST EXCISIONAL BIOPSY Left      OB History     Gravida  0   Para  0   Term  0   Preterm  0   AB  0   Living  0      SAB  0   IAB  0   Ectopic  0   Multiple  0   Live Births  0           Family History  Problem Relation Age of Onset   Breast cancer Neg Hx     Social History   Tobacco Use   Smoking status: Never    Passive exposure: Never   Smokeless tobacco: Never  Vaping Use   Vaping Use: Never used  Substance Use Topics   Alcohol use: Yes    Comment: Social   Drug use: Never    Home Medications Prior to Admission medications   Not on File    Allergies    Patient has no known allergies.  Review of Systems   Review of Systems  Constitutional:  Negative for fever.  HENT:  Negative for sore throat.   Eyes:  Negative for redness.  Respiratory:  Negative for cough and shortness of breath.    Cardiovascular:  Positive for chest pain. Negative for palpitations and leg swelling.  Gastrointestinal:  Negative for abdominal pain, nausea and vomiting.  Genitourinary:  Negative for flank pain.  Musculoskeletal:  Negative for back pain.  Skin:  Negative for rash.  Neurological:  Negative for headaches.  Hematological:  Does not bruise/bleed easily.  Psychiatric/Behavioral:  Negative for confusion.    Physical Exam Updated Vital Signs BP (!) 157/81   Pulse (!) 56   Temp 98 F (36.7 C) (Oral)   Resp 17   Ht 1.676 m ('5\' 6"'$ )   Wt 74.8 kg   LMP 06/01/2010   SpO2 98%   BMI 26.63 kg/m   Physical Exam Vitals and nursing note reviewed.  Constitutional:      Appearance: Normal appearance. She is well-developed.  HENT:     Head: Atraumatic.     Nose: Nose normal.     Mouth/Throat:     Mouth: Mucous membranes are moist.  Eyes:     General: No scleral icterus.    Conjunctiva/sclera: Conjunctivae  normal.  Neck:     Trachea: No tracheal deviation.  Cardiovascular:     Rate and Rhythm: Normal rate and regular rhythm.     Pulses: Normal pulses.     Heart sounds: Normal heart sounds. No murmur heard.   No friction rub. No gallop.  Pulmonary:     Effort: Pulmonary effort is normal. No respiratory distress.     Breath sounds: Normal breath sounds.  Chest:     Chest wall: No tenderness.  Abdominal:     General: Bowel sounds are normal. There is no distension.     Palpations: Abdomen is soft.     Tenderness: There is no abdominal tenderness.  Genitourinary:    Comments: No cva tenderness.  Musculoskeletal:        General: No swelling or tenderness.     Cervical back: Normal range of motion and neck supple. No rigidity. No muscular tenderness.     Right lower leg: No edema.     Left lower leg: No edema.  Skin:    General: Skin is warm and dry.     Findings: No rash.  Neurological:     Mental Status: She is alert.     Comments: Alert, speech normal.   Psychiatric:         Mood and Affect: Mood normal.    ED Results / Procedures / Treatments   Labs (all labs ordered are listed, but only abnormal results are displayed) Results for orders placed or performed during the hospital encounter of [id number]  Basic metabolic panel  Result Value Ref Range   Sodium 139 135 - 145 mmol/L   Potassium 4.1 3.5 - 5.1 mmol/L   Chloride 103 98 - 111 mmol/L   CO2 28 22 - 32 mmol/L   Glucose, Bld 95 70 - 99 mg/dL   BUN 16 8 - 23 mg/dL   Creatinine, Ser 0.81 0.44 - 1.00 mg/dL   Calcium 10.2 8.9 - 10.3 mg/dL   GFR, Estimated >60 >60 mL/min   Anion gap 8 5 - 15  CBC  Result Value Ref Range   WBC 5.8 4.0 - 10.5 K/uL   RBC 5.12 (H) 3.87 - 5.11 MIL/uL   Hemoglobin 14.5 12.0 - 15.0 g/dL   HCT 44.4 36.0 - 46.0 %   MCV 86.7 80.0 - 100.0 fL   MCH 28.3 26.0 - 34.0 pg   MCHC 32.7 30.0 - 36.0 g/dL   RDW 13.5 11.5 - 15.5 %   Platelets 257 150 - 400 K/uL   nRBC 0.0 0.0 - 0.2 %  Troponin I (High Sensitivity)  Result Value Ref Range   Troponin I (High Sensitivity) 5 <18 ng/L  Troponin I (High Sensitivity)  Result Value Ref Range   Troponin I (High Sensitivity) 6 <18 ng/L   DG Chest 2 View  Result Date: 09/10/2020 CLINICAL DATA:  Chest pain EXAM: CHEST - 2 VIEW COMPARISON:  None. FINDINGS: Mild hyperinflation. Heart and mediastinal contours are within normal limits. No focal opacities or effusions. No acute bony abnormality. IMPRESSION: No active cardiopulmonary disease. Electronically Signed   By: Rolm Baptise M.D.   On: 09/10/2020 13:19     EKG EKG Interpretation  Date/Time:  Sunday September 10 2020 12:44:42 EDT Ventricular Rate:  62 PR Interval:  184 QRS Duration: 68 QT Interval:  388 QTC Calculation: 393 R Axis:   84 Text Interpretation: Normal sinus rhythm Low voltage QRS Nonspecific T wave abnormality No previous tracing Confirmed by Lajean Saver (  [id number]) on 09/10/2020 12:52:11 PM  Radiology DG Chest 2 View  Result Date: 09/10/2020 CLINICAL DATA:  Chest pain EXAM:  CHEST - 2 VIEW COMPARISON:  None. FINDINGS: Mild hyperinflation. Heart and mediastinal contours are within normal limits. No focal opacities or effusions. No acute bony abnormality. IMPRESSION: No active cardiopulmonary disease. Electronically Signed   By: Rolm Baptise M.D.   On: 09/10/2020 13:19    Procedures Procedures   Medications Ordered in ED Medications - No data to display  ED Course  I have reviewed the triage vital signs and the nursing notes.  Pertinent labs & imaging results that were available during my care of the patient were reviewed by me and considered in my medical decision making (see chart for details).    MDM Rules/Calculators/A&P                          Iv ns. Ecg. Stat labs and cxr.  Reviewed nursing notes and prior charts for additional history.   Labs reviewed/interpreted by me - initial trop normal.   CXR reviewed/interpreted by me - no pna.   Await 2nd trop. No cp or discomfort.   Delta trop normal and not increasing - felt not c/w acs.   Recheck pt again, no cp or sob.   Discussed plan for outpt cardiology f/u.  Return precautions provided.     Final Clinical Impression(s) / ED Diagnoses Final diagnoses:  Precordial chest pain    Rx / DC Orders ED Discharge Orders     None        Lajean Saver, MD 09/10/20 1544

## 2020-09-10 NOTE — ED Triage Notes (Signed)
Patient reports to the ER for chest pain and radiating pressure to the jaw. Patient reports it started around 27 and has gotten progressive since. Patient denies Nausea or emesis. Denies ShOB.

## 2020-09-10 NOTE — Discharge Instructions (Addendum)
It was our pleasure to provide your ER care today - we hope that you feel better.  If gi symptoms, try pepcid and maalox for symptom relief.   For chest discomfort, follow up with cardiologist in one week - call office tomorrow AM to arrange follow up visit.  Also follow up for blood pressure which is a bit high today.   Return to [facility] ER if worse, recurrent or persistent chest pain, trouble breathing, or other concern.

## 2020-09-13 ENCOUNTER — Ambulatory Visit
Admission: RE | Admit: 2020-09-13 | Discharge: 2020-09-13 | Disposition: A | Discharge status: Home / Self Care | Payer: Medicare Other | Source: Ambulatory Visit | Attending: Family Medicine | Admitting: Family Medicine

## 2020-09-13 ENCOUNTER — Other Ambulatory Visit: Payer: Self-pay

## 2020-09-13 DIAGNOSIS — Z1231 Encounter for screening mammogram for malignant neoplasm of breast: Secondary | ICD-10-CM

## 2020-09-18 ENCOUNTER — Other Ambulatory Visit: Payer: Self-pay | Admitting: Family Medicine

## 2020-09-18 DIAGNOSIS — R928 Other abnormal and inconclusive findings on diagnostic imaging of breast: Secondary | ICD-10-CM

## 2020-09-29 ENCOUNTER — Ambulatory Visit: Payer: Medicare Other

## 2020-09-29 ENCOUNTER — Ambulatory Visit
Admission: RE | Admit: 2020-09-29 | Discharge: 2020-09-29 | Disposition: A | Discharge status: Home / Self Care | Payer: Medicare Other | Source: Ambulatory Visit | Attending: Family Medicine | Admitting: Family Medicine

## 2020-09-29 ENCOUNTER — Other Ambulatory Visit: Payer: Self-pay

## 2020-09-29 DIAGNOSIS — R928 Other abnormal and inconclusive findings on diagnostic imaging of breast: Secondary | ICD-10-CM | POA: Diagnosis not present

## 2020-09-29 DIAGNOSIS — N6489 Other specified disorders of breast: Secondary | ICD-10-CM | POA: Diagnosis not present

## 2020-09-29 DIAGNOSIS — R922 Inconclusive mammogram: Secondary | ICD-10-CM | POA: Diagnosis not present

## 2020-10-09 DIAGNOSIS — Z79899 Other long term (current) drug therapy: Secondary | ICD-10-CM | POA: Diagnosis not present

## 2020-10-09 DIAGNOSIS — E039 Hypothyroidism, unspecified: Secondary | ICD-10-CM | POA: Diagnosis not present

## 2020-10-09 DIAGNOSIS — M8588 Other specified disorders of bone density and structure, other site: Secondary | ICD-10-CM | POA: Diagnosis not present

## 2020-10-09 DIAGNOSIS — N8110 Cystocele, unspecified: Secondary | ICD-10-CM | POA: Diagnosis not present

## 2020-10-09 DIAGNOSIS — N3281 Overactive bladder: Secondary | ICD-10-CM | POA: Diagnosis not present

## 2021-01-09 DIAGNOSIS — N3281 Overactive bladder: Secondary | ICD-10-CM | POA: Diagnosis not present

## 2021-01-11 DIAGNOSIS — M1811 Unilateral primary osteoarthritis of first carpometacarpal joint, right hand: Secondary | ICD-10-CM | POA: Diagnosis not present

## 2021-01-25 DIAGNOSIS — L814 Other melanin hyperpigmentation: Secondary | ICD-10-CM | POA: Diagnosis not present

## 2021-01-25 DIAGNOSIS — D1801 Hemangioma of skin and subcutaneous tissue: Secondary | ICD-10-CM | POA: Diagnosis not present

## 2021-01-25 DIAGNOSIS — L821 Other seborrheic keratosis: Secondary | ICD-10-CM | POA: Diagnosis not present

## 2021-01-25 DIAGNOSIS — L300 Nummular dermatitis: Secondary | ICD-10-CM | POA: Diagnosis not present

## 2021-01-25 DIAGNOSIS — L718 Other rosacea: Secondary | ICD-10-CM | POA: Diagnosis not present

## 2021-03-12 DIAGNOSIS — N3281 Overactive bladder: Secondary | ICD-10-CM | POA: Diagnosis not present

## 2021-04-11 DIAGNOSIS — Z0000 Encounter for general adult medical examination without abnormal findings: Secondary | ICD-10-CM | POA: Diagnosis not present

## 2021-04-11 DIAGNOSIS — Z79899 Other long term (current) drug therapy: Secondary | ICD-10-CM | POA: Diagnosis not present

## 2021-04-11 DIAGNOSIS — Z86010 Personal history of colonic polyps: Secondary | ICD-10-CM | POA: Diagnosis not present

## 2021-04-11 DIAGNOSIS — M1811 Unilateral primary osteoarthritis of first carpometacarpal joint, right hand: Secondary | ICD-10-CM | POA: Diagnosis not present

## 2021-04-11 DIAGNOSIS — E039 Hypothyroidism, unspecified: Secondary | ICD-10-CM | POA: Diagnosis not present

## 2021-04-11 DIAGNOSIS — M8588 Other specified disorders of bone density and structure, other site: Secondary | ICD-10-CM | POA: Diagnosis not present

## 2021-04-11 DIAGNOSIS — N3281 Overactive bladder: Secondary | ICD-10-CM | POA: Diagnosis not present

## 2021-04-11 DIAGNOSIS — E7800 Pure hypercholesterolemia, unspecified: Secondary | ICD-10-CM | POA: Diagnosis not present

## 2021-04-11 DIAGNOSIS — M859 Disorder of bone density and structure, unspecified: Secondary | ICD-10-CM | POA: Diagnosis not present

## 2021-04-16 ENCOUNTER — Other Ambulatory Visit: Payer: Self-pay | Admitting: Family Medicine

## 2021-04-16 DIAGNOSIS — M8588 Other specified disorders of bone density and structure, other site: Secondary | ICD-10-CM

## 2021-04-20 ENCOUNTER — Other Ambulatory Visit: Payer: Self-pay | Admitting: Family Medicine

## 2021-04-20 DIAGNOSIS — Z1231 Encounter for screening mammogram for malignant neoplasm of breast: Secondary | ICD-10-CM

## 2021-05-10 DIAGNOSIS — U071 COVID-19: Secondary | ICD-10-CM | POA: Diagnosis not present

## 2021-06-17 ENCOUNTER — Encounter (HOSPITAL_BASED_OUTPATIENT_CLINIC_OR_DEPARTMENT_OTHER): Payer: Self-pay

## 2021-06-17 ENCOUNTER — Other Ambulatory Visit: Payer: Self-pay

## 2021-06-17 ENCOUNTER — Emergency Department (HOSPITAL_BASED_OUTPATIENT_CLINIC_OR_DEPARTMENT_OTHER)
Admission: EM | Admit: 2021-06-17 | Discharge: 2021-06-17 | Disposition: A | Discharge status: Home / Self Care | Payer: Medicare Other | Attending: Emergency Medicine | Admitting: Emergency Medicine

## 2021-06-17 DIAGNOSIS — R040 Epistaxis: Secondary | ICD-10-CM | POA: Diagnosis not present

## 2021-06-17 DIAGNOSIS — E860 Dehydration: Secondary | ICD-10-CM | POA: Insufficient documentation

## 2021-06-17 DIAGNOSIS — R110 Nausea: Secondary | ICD-10-CM | POA: Diagnosis not present

## 2021-06-17 LAB — URINALYSIS, ROUTINE W REFLEX MICROSCOPIC
Bilirubin Urine: NEGATIVE
Glucose, UA: NEGATIVE mg/dL
Hgb urine dipstick: NEGATIVE
Ketones, ur: 40 mg/dL — AB
Leukocytes,Ua: NEGATIVE
Nitrite: NEGATIVE
Specific Gravity, Urine: 1.018 (ref 1.005–1.030)
pH: 8 (ref 5.0–8.0)

## 2021-06-17 LAB — COMPREHENSIVE METABOLIC PANEL
ALT: 15 U/L (ref 0–44)
AST: 21 U/L (ref 15–41)
Albumin: 4.8 g/dL (ref 3.5–5.0)
Alkaline Phosphatase: 48 U/L (ref 38–126)
Anion gap: 10 (ref 5–15)
BUN: 15 mg/dL (ref 8–23)
CO2: 23 mmol/L (ref 22–32)
Calcium: 10.3 mg/dL (ref 8.9–10.3)
Chloride: 105 mmol/L (ref 98–111)
Creatinine, Ser: 0.7 mg/dL (ref 0.44–1.00)
GFR, Estimated: >60 mL/min (ref >60)
Glucose, Bld: 122 mg/dL — ABNORMAL HIGH (ref 70–99)
Potassium: 4.9 mmol/L (ref 3.5–5.1)
Sodium: 138 mmol/L (ref 135–145)
Total Bilirubin: 0.7 mg/dL (ref 0.3–1.2)
Total Protein: 7.4 g/dL (ref 6.5–8.1)

## 2021-06-17 LAB — CBC
HCT: 44.7 % (ref 36.0–46.0)
Hemoglobin: 14.6 g/dL (ref 12.0–15.0)
MCH: 27.4 pg (ref 26.0–34.0)
MCHC: 32.7 g/dL (ref 30.0–36.0)
MCV: 84 fL (ref 80.0–100.0)
Platelets: 261 10*3/uL (ref 150–400)
RBC: 5.32 MIL/uL — ABNORMAL HIGH (ref 3.87–5.11)
RDW: 13 % (ref 11.5–15.5)
WBC: 6.1 10*3/uL (ref 4.0–10.5)
nRBC: 0 % (ref 0.0–0.2)

## 2021-06-17 LAB — LIPASE, BLOOD: Lipase: 27 U/L (ref 11–51)

## 2021-06-17 MED ORDER — ONDANSETRON HCL 4 MG/2ML IJ SOLN
4.0000 mg | Freq: Once | INTRAMUSCULAR | Status: AC
  Administered 2021-06-17: 4 mg via INTRAVENOUS
  Filled 2021-06-17: qty 2

## 2021-06-17 MED ORDER — SODIUM CHLORIDE 0.9 % IV BOLUS
1000.0000 mL | Freq: Once | INTRAVENOUS | Status: AC
  Administered 2021-06-17: 1000 mL via INTRAVENOUS

## 2021-06-17 MED ORDER — ONDANSETRON HCL 4 MG PO TABS: 4.0000 mg | ORAL_TABLET | Freq: Three times a day (TID) | ORAL | 0 refills | Status: AC | PRN

## 2021-06-17 MED ORDER — ONDANSETRON 4 MG PO TBDP
4.0000 mg | ORAL_TABLET | Freq: Once | ORAL | Status: AC | PRN
  Administered 2021-06-17: 4 mg via ORAL
  Filled 2021-06-17: qty 1

## 2021-06-17 NOTE — ED Provider Notes (Signed)
[facility] EMERGENCY DEPT Provider Note   CSN: [id number] Arrival date & time: 06/17/21  1730     History  Chief Complaint  Patient presents with   Epistaxis   Nausea    Karla Lucas is a 66 y.o. female.  The history is provided by the patient and medical records. No language interpreter was used.  Epistaxis  66 year old female presenting complaining of nausea.  Patient report yesterday morning she was awoke with nosebleed coming from her right nares.  States it went down to the back of her throat, she may have swallowed a few times woke up and noticed bleeding coming from the right side of her nose.  It lasted for approximately 45 minutes and resolved.  She felt fine throughout the day but towards nighttime she felt a bit nauseous, this morning her nausea became a little more intense, she did vomit and while vomiting she had a brief episode of nosebleed from the right nares lasting only for a few minutes.  She felt little bit queasy but her symptom is improving.  Nausea seems to help after she received Zofran in the ER.  She denies any lightheadedness or dizziness no chest pain no sore throat no abdominal pain.  She is not on any blood thinner medication.  She denies any recent trauma.  No runny nose sneezing or coughing or cold symptoms.  Home Medications Prior to Admission medications   Not on File      Allergies    Prednisone    Review of Systems   Review of Systems  HENT:  Positive for nosebleeds.   All other systems reviewed and are negative.  Physical Exam Updated Vital Signs BP (!) 143/80 (BP Location: Right Arm)   Pulse 78   Temp 98.1 F (36.7 C) (Oral)   Resp 20   Ht '5\' 8"'$  (1.727 m)   Wt 73.5 kg   LMP 06/01/2010   SpO2 99%   BMI 24.63 kg/m  Physical Exam Vitals and nursing note reviewed.  Constitutional:      General: She is not in acute distress.    Appearance: She is well-developed.  HENT:     Head: Atraumatic.     Nose: Nose  normal. No congestion or rhinorrhea.     Mouth/Throat:     Mouth: Mucous membranes are moist.  Eyes:     Conjunctiva/sclera: Conjunctivae normal.  Cardiovascular:     Rate and Rhythm: Normal rate and regular rhythm.     Pulses: Normal pulses.     Heart sounds: Normal heart sounds.  Pulmonary:     Effort: Pulmonary effort is normal.     Breath sounds: No wheezing, rhonchi or rales.  Abdominal:     General: Bowel sounds are normal.     Palpations: Abdomen is soft.     Tenderness: There is no abdominal tenderness.  Musculoskeletal:     Cervical back: Normal range of motion and neck supple.  Skin:    Findings: No rash.  Neurological:     Mental Status: She is alert. Mental status is at baseline.  Psychiatric:        Mood and Affect: Mood normal.    ED Results / Procedures / Treatments   Labs (all labs ordered are listed, but only abnormal results are displayed) Labs Reviewed  COMPREHENSIVE METABOLIC PANEL - Abnormal; Notable for the following components:      Result Value   Glucose, Bld 122 (*)    All other components within  normal limits  CBC - Abnormal; Notable for the following components:   RBC 5.32 (*)    All other components within normal limits  URINALYSIS, ROUTINE W REFLEX MICROSCOPIC - Abnormal; Notable for the following components:   Ketones, ur 40 (*)    Protein, ur TRACE (*)    Bacteria, UA RARE (*)    All other components within normal limits  LIPASE, BLOOD    EKG None  Radiology No results found.  Procedures Procedures    Medications Ordered in ED Medications  ondansetron (ZOFRAN-ODT) disintegrating tablet 4 mg (4 mg Oral Given 06/17/21 1757)  sodium chloride 0.9 % bolus 1,000 mL (0 mLs Intravenous Stopped 06/17/21 2124)  ondansetron (ZOFRAN) injection 4 mg (4 mg Intravenous Given 06/17/21 2004)    ED Course/ Medical Decision Making/ A&P                           Medical Decision Making Amount and/or Complexity of Data Reviewed Labs:  ordered.  Risk Prescription drug management.   BP (!) 143/80 (BP Location: Right Arm)   Pulse 78   Temp 98.1 F (36.7 C) (Oral)   Resp 20   Ht '5\' 8"'$  (1.727 m)   Wt 73.5 kg   LMP 06/01/2010   SpO2 99%   BMI 24.63 kg/m   7:29 PM This is a 66 year old female presenting with complaints of nausea as well as having several episodes of epistasis.  Epistasis involving the right nares.  1 episode yesterday lasting for about 45 minutes, and another brief episode today when she was actively vomiting and lasted for a few minutes.  At this time she feels much better.  On exam, no active nosebleed, abdomen soft nontender, vital signs fairly unremarkable no hypoxia no tachycardia no fever.  Normal blood pressure.  I suspect nosebleed may have caused or contributed to her nausea.  I have low suspicion for infectious etiology causing his symptoms.  She does not have any urinary symptoms   Labs obtained and independently viewed interpreted by me.  Labs are reassuring, normal WBC, no electrolyte derangement, normal hemoglobin, urinalysis without urinary tract infection but does shows 40 ketones which may reflect some mild dehydration.  Will give IV fluid and Zofran here but anticipate patient can be discharge afterward.  I have considered advanced imaging such as abdominal and pelvis CT scan but in the setting of no abdominal pain and labs are reassuring I do not think CT scan is indicated.  This patient presents to the ED for concern of nausea, this involves an extensive number of treatment options, and is a complaint that carries with it a high risk of complications and morbidity.  The differential diagnosis includes adverse reaction to nosebleed, abdominal infection, food poisoning  Co morbidities that complicate the patient evaluation age Additional history obtained:  Additional history obtained from patient External records from outside source obtained and reviewed including outpt notes from  PCP  Lab Tests:  I Ordered, and personally interpreted labs.  The pertinent results include:  40 ketones in UA suggestive of mild dehydration  Cardiac Monitoring:  The patient was maintained on a cardiac monitor.  I personally viewed and interpreted the cardiac monitored which showed an underlying rhythm of: NSR  Medicines ordered and prescription drug management:  I ordered medication including IVF  for dehydration Reevaluation of the patient after these medicines showed that the patient improved I have reviewed the patients home medicines and have  made adjustments as needed  Test Considered: abd/pelvis CT but felt it would be low yield given no abd pain  Critical Interventions: IVF   Problem List / ED Course: nausea  Reevaluation:  After the interventions noted above, I reevaluated the patient and found that they have :improved  Social Determinants of Health:   Dispostion:  After consideration of the diagnostic results and the patients response to treatment, I feel that the patent would benefit from outpt f/u.         Final Clinical Impression(s) / ED Diagnoses Final diagnoses:  Nausea  Dehydration    Rx / DC Orders ED Discharge Orders          Ordered    ondansetron (ZOFRAN) 4 MG tablet  Every 8 hours PRN        06/17/21 2007              Domenic Moras, PA-C 06/21/21 5170    Luna Fuse, MD 06/29/21 2249

## 2021-06-17 NOTE — ED Triage Notes (Signed)
Pt states she had a nose bleed yesterday. Today pt had N/V, chills and another nose bleed today.  ?

## 2021-06-17 NOTE — ED Notes (Signed)
Discharge paperwork given and understood. 

## 2021-06-17 NOTE — Discharge Instructions (Addendum)
You have been evaluated for your symptoms.  Fortunately your labs are reassuring.  May be some mild signs of dehydration were noted.  You received IV fluid.  Take zofran as needed for nausea and follow up with your doctor for further care.  If you develop persistent nosebleed, please return. ?

## 2021-07-23 DIAGNOSIS — K5900 Constipation, unspecified: Secondary | ICD-10-CM | POA: Diagnosis not present

## 2021-08-30 DIAGNOSIS — N3281 Overactive bladder: Secondary | ICD-10-CM | POA: Diagnosis not present

## 2021-08-30 DIAGNOSIS — Z4689 Encounter for fitting and adjustment of other specified devices: Secondary | ICD-10-CM | POA: Diagnosis not present

## 2021-09-05 DIAGNOSIS — Z4689 Encounter for fitting and adjustment of other specified devices: Secondary | ICD-10-CM | POA: Diagnosis not present

## 2021-10-01 ENCOUNTER — Ambulatory Visit
Admission: RE | Admit: 2021-10-01 | Discharge: 2021-10-01 | Disposition: A | Discharge status: Home / Self Care | Payer: Medicare Other | Source: Ambulatory Visit | Attending: Family Medicine | Admitting: Family Medicine

## 2021-10-01 DIAGNOSIS — Z1231 Encounter for screening mammogram for malignant neoplasm of breast: Secondary | ICD-10-CM

## 2021-10-01 DIAGNOSIS — M85832 Other specified disorders of bone density and structure, left forearm: Secondary | ICD-10-CM | POA: Diagnosis not present

## 2021-10-01 DIAGNOSIS — Z780 Asymptomatic menopausal state: Secondary | ICD-10-CM | POA: Diagnosis not present

## 2021-10-01 DIAGNOSIS — M8588 Other specified disorders of bone density and structure, other site: Secondary | ICD-10-CM

## 2021-10-04 ENCOUNTER — Ambulatory Visit: Payer: Self-pay

## 2021-10-04 NOTE — Patient Outreach (Signed)
  Care Coordination   10/04/2021 Name: Karla Lucas MRN: [id number] DOB: 07/10/1955   Care Coordination Outreach Attempts:  An unsuccessful telephone outreach was attempted today to offer the patient information about available care coordination services as a benefit of their health plan.   Follow Up Plan: Spoke with patient's spouse.  Additional outreach attempts will be made to offer the patient care coordination information and services.   Encounter Outcome:  Pt. Scheduled  Care Coordination Interventions Activated:  No   Care Coordination Interventions:  No, not indicated    [facility] [phone]

## 2021-10-09 DIAGNOSIS — R079 Chest pain, unspecified: Secondary | ICD-10-CM | POA: Diagnosis not present

## 2021-10-16 DIAGNOSIS — M25541 Pain in joints of right hand: Secondary | ICD-10-CM | POA: Diagnosis not present

## 2021-10-16 DIAGNOSIS — M25542 Pain in joints of left hand: Secondary | ICD-10-CM | POA: Diagnosis not present

## 2021-10-16 DIAGNOSIS — M1811 Unilateral primary osteoarthritis of first carpometacarpal joint, right hand: Secondary | ICD-10-CM | POA: Diagnosis not present

## 2021-10-26 DIAGNOSIS — Z4689 Encounter for fitting and adjustment of other specified devices: Secondary | ICD-10-CM | POA: Diagnosis not present

## 2021-11-14 DIAGNOSIS — Z4689 Encounter for fitting and adjustment of other specified devices: Secondary | ICD-10-CM | POA: Diagnosis not present

## 2021-11-23 ENCOUNTER — Telehealth: Payer: Self-pay

## 2021-11-23 NOTE — Patient Outreach (Signed)
  Care Coordination   Initial Visit Note   11/23/2021 Name: Karla Lucas MRN: [id number] DOB: Aug 15, 1955  Karla Lucas is a 66 y.o. year old female. I spoke with  Cira Rue by phone today.  What matters to the patients health and wellness today?  No Concerns Expressed/Requested Call Back    Goals Addressed   None     SDOH assessments and interventions completed:  No     Care Coordination Interventions Activated:  No  Care Coordination Interventions:  No, not indicated   Follow up plan:  Will follow up next week.    Encounter Outcome:  Pt. Request to Call Back   [facility] [phone]

## 2021-12-19 DIAGNOSIS — Z4689 Encounter for fitting and adjustment of other specified devices: Secondary | ICD-10-CM | POA: Diagnosis not present

## 2022-02-26 DIAGNOSIS — L821 Other seborrheic keratosis: Secondary | ICD-10-CM | POA: Diagnosis not present

## 2022-02-26 DIAGNOSIS — L308 Other specified dermatitis: Secondary | ICD-10-CM | POA: Diagnosis not present

## 2022-02-26 DIAGNOSIS — L814 Other melanin hyperpigmentation: Secondary | ICD-10-CM | POA: Diagnosis not present

## 2022-02-26 DIAGNOSIS — L570 Actinic keratosis: Secondary | ICD-10-CM | POA: Diagnosis not present

## 2022-02-26 DIAGNOSIS — L438 Other lichen planus: Secondary | ICD-10-CM | POA: Diagnosis not present

## 2022-02-26 DIAGNOSIS — L738 Other specified follicular disorders: Secondary | ICD-10-CM | POA: Diagnosis not present

## 2022-02-26 DIAGNOSIS — D1801 Hemangioma of skin and subcutaneous tissue: Secondary | ICD-10-CM | POA: Diagnosis not present

## 2022-03-01 ENCOUNTER — Other Ambulatory Visit: Payer: Self-pay | Admitting: Family Medicine

## 2022-03-01 DIAGNOSIS — Z1231 Encounter for screening mammogram for malignant neoplasm of breast: Secondary | ICD-10-CM

## 2022-03-05 DIAGNOSIS — G4700 Insomnia, unspecified: Secondary | ICD-10-CM | POA: Diagnosis not present

## 2022-03-27 DIAGNOSIS — Z4689 Encounter for fitting and adjustment of other specified devices: Secondary | ICD-10-CM | POA: Diagnosis not present

## 2022-03-27 DIAGNOSIS — N3281 Overactive bladder: Secondary | ICD-10-CM | POA: Diagnosis not present

## 2022-04-03 ENCOUNTER — Other Ambulatory Visit: Payer: Self-pay

## 2022-04-03 DIAGNOSIS — Z4689 Encounter for fitting and adjustment of other specified devices: Secondary | ICD-10-CM | POA: Diagnosis not present

## 2022-04-09 DIAGNOSIS — Z4689 Encounter for fitting and adjustment of other specified devices: Secondary | ICD-10-CM | POA: Diagnosis not present

## 2022-04-15 DIAGNOSIS — E039 Hypothyroidism, unspecified: Secondary | ICD-10-CM | POA: Diagnosis not present

## 2022-04-15 DIAGNOSIS — Z0000 Encounter for general adult medical examination without abnormal findings: Secondary | ICD-10-CM | POA: Diagnosis not present

## 2022-04-19 ENCOUNTER — Other Ambulatory Visit (HOSPITAL_COMMUNITY): Payer: Self-pay | Admitting: Family Medicine

## 2022-04-19 DIAGNOSIS — Z79899 Other long term (current) drug therapy: Secondary | ICD-10-CM | POA: Diagnosis not present

## 2022-04-19 DIAGNOSIS — Z0000 Encounter for general adult medical examination without abnormal findings: Secondary | ICD-10-CM | POA: Diagnosis not present

## 2022-04-19 DIAGNOSIS — N3281 Overactive bladder: Secondary | ICD-10-CM | POA: Diagnosis not present

## 2022-04-19 DIAGNOSIS — K219 Gastro-esophageal reflux disease without esophagitis: Secondary | ICD-10-CM | POA: Diagnosis not present

## 2022-04-19 DIAGNOSIS — G4700 Insomnia, unspecified: Secondary | ICD-10-CM | POA: Diagnosis not present

## 2022-04-19 DIAGNOSIS — R1032 Left lower quadrant pain: Secondary | ICD-10-CM

## 2022-04-19 DIAGNOSIS — M8580 Other specified disorders of bone density and structure, unspecified site: Secondary | ICD-10-CM | POA: Diagnosis not present

## 2022-04-19 DIAGNOSIS — Z6824 Body mass index (BMI) 24.0-24.9, adult: Secondary | ICD-10-CM | POA: Diagnosis not present

## 2022-04-19 DIAGNOSIS — M8588 Other specified disorders of bone density and structure, other site: Secondary | ICD-10-CM | POA: Diagnosis not present

## 2022-04-21 ENCOUNTER — Ambulatory Visit (HOSPITAL_BASED_OUTPATIENT_CLINIC_OR_DEPARTMENT_OTHER): Payer: Medicare Other

## 2022-04-23 ENCOUNTER — Ambulatory Visit (HOSPITAL_COMMUNITY)
Admission: RE | Admit: 2022-04-23 | Discharge: 2022-04-23 | Disposition: A | Discharge status: Home / Self Care | Payer: Medicare Other | Source: Ambulatory Visit | Attending: Family Medicine | Admitting: Family Medicine

## 2022-04-23 DIAGNOSIS — R1032 Left lower quadrant pain: Secondary | ICD-10-CM | POA: Diagnosis not present

## 2022-04-23 DIAGNOSIS — I700 Atherosclerosis of aorta: Secondary | ICD-10-CM | POA: Diagnosis not present

## 2022-04-23 MED ORDER — IOHEXOL 9 MG/ML PO SOLN
500.0000 mL | ORAL | Status: AC
  Administered 2022-04-23: 1000 mL via ORAL

## 2022-04-23 MED ORDER — IOHEXOL 9 MG/ML PO SOLN
ORAL | Status: AC
  Filled 2022-04-23: qty 1000

## 2022-04-23 MED ORDER — IOHEXOL 300 MG/ML  SOLN
100.0000 mL | Freq: Once | INTRAMUSCULAR | Status: AC | PRN
  Administered 2022-04-23: 100 mL via INTRAVENOUS

## 2022-05-02 DIAGNOSIS — M25532 Pain in left wrist: Secondary | ICD-10-CM | POA: Diagnosis not present

## 2022-05-02 DIAGNOSIS — M654 Radial styloid tenosynovitis [de Quervain]: Secondary | ICD-10-CM | POA: Diagnosis not present

## 2022-07-23 DIAGNOSIS — N3281 Overactive bladder: Secondary | ICD-10-CM | POA: Diagnosis not present

## 2022-07-23 DIAGNOSIS — Z4689 Encounter for fitting and adjustment of other specified devices: Secondary | ICD-10-CM | POA: Diagnosis not present

## 2022-07-26 DIAGNOSIS — I700 Atherosclerosis of aorta: Secondary | ICD-10-CM | POA: Diagnosis not present

## 2022-08-21 DIAGNOSIS — D1801 Hemangioma of skin and subcutaneous tissue: Secondary | ICD-10-CM | POA: Diagnosis not present

## 2022-08-21 DIAGNOSIS — L821 Other seborrheic keratosis: Secondary | ICD-10-CM | POA: Diagnosis not present

## 2022-08-21 DIAGNOSIS — L814 Other melanin hyperpigmentation: Secondary | ICD-10-CM | POA: Diagnosis not present

## 2022-08-21 DIAGNOSIS — L570 Actinic keratosis: Secondary | ICD-10-CM | POA: Diagnosis not present

## 2022-08-21 DIAGNOSIS — L718 Other rosacea: Secondary | ICD-10-CM | POA: Diagnosis not present

## 2022-10-04 ENCOUNTER — Ambulatory Visit: Payer: Medicare Other

## 2022-10-08 ENCOUNTER — Ambulatory Visit: Admission: RE | Admit: 2022-10-08 | Payer: Medicare Other | Source: Ambulatory Visit

## 2022-10-08 DIAGNOSIS — Z1231 Encounter for screening mammogram for malignant neoplasm of breast: Secondary | ICD-10-CM | POA: Diagnosis not present

## 2022-10-09 DIAGNOSIS — M654 Radial styloid tenosynovitis [de Quervain]: Secondary | ICD-10-CM | POA: Diagnosis not present

## 2022-11-04 DIAGNOSIS — M67432 Ganglion, left wrist: Secondary | ICD-10-CM | POA: Diagnosis not present

## 2022-11-04 DIAGNOSIS — M25532 Pain in left wrist: Secondary | ICD-10-CM | POA: Diagnosis not present

## 2022-11-12 DIAGNOSIS — Z4689 Encounter for fitting and adjustment of other specified devices: Secondary | ICD-10-CM | POA: Diagnosis not present

## 2022-12-03 DIAGNOSIS — L244 Irritant contact dermatitis due to drugs in contact with skin: Secondary | ICD-10-CM | POA: Diagnosis not present

## 2022-12-03 DIAGNOSIS — L0889 Other specified local infections of the skin and subcutaneous tissue: Secondary | ICD-10-CM | POA: Diagnosis not present

## 2022-12-03 DIAGNOSIS — B9689 Other specified bacterial agents as the cause of diseases classified elsewhere: Secondary | ICD-10-CM | POA: Diagnosis not present

## 2023-01-30 DIAGNOSIS — U071 COVID-19: Secondary | ICD-10-CM | POA: Diagnosis not present

## 2023-02-13 DIAGNOSIS — Z4689 Encounter for fitting and adjustment of other specified devices: Secondary | ICD-10-CM | POA: Diagnosis not present

## 2023-02-28 DIAGNOSIS — H52223 Regular astigmatism, bilateral: Secondary | ICD-10-CM | POA: Diagnosis not present

## 2023-02-28 DIAGNOSIS — H5203 Hypermetropia, bilateral: Secondary | ICD-10-CM | POA: Diagnosis not present

## 2023-02-28 DIAGNOSIS — H524 Presbyopia: Secondary | ICD-10-CM | POA: Diagnosis not present

## 2023-02-28 DIAGNOSIS — H2513 Age-related nuclear cataract, bilateral: Secondary | ICD-10-CM | POA: Diagnosis not present

## 2023-03-18 ENCOUNTER — Other Ambulatory Visit (HOSPITAL_COMMUNITY): Payer: Self-pay

## 2023-03-28 DIAGNOSIS — Z4689 Encounter for fitting and adjustment of other specified devices: Secondary | ICD-10-CM | POA: Diagnosis not present

## 2023-04-17 DIAGNOSIS — Z5181 Encounter for therapeutic drug level monitoring: Secondary | ICD-10-CM | POA: Diagnosis not present

## 2023-04-17 DIAGNOSIS — M8580 Other specified disorders of bone density and structure, unspecified site: Secondary | ICD-10-CM | POA: Diagnosis not present

## 2023-04-17 DIAGNOSIS — Z79899 Other long term (current) drug therapy: Secondary | ICD-10-CM | POA: Diagnosis not present

## 2023-04-17 DIAGNOSIS — Z0000 Encounter for general adult medical examination without abnormal findings: Secondary | ICD-10-CM | POA: Diagnosis not present

## 2023-04-17 DIAGNOSIS — Z1159 Encounter for screening for other viral diseases: Secondary | ICD-10-CM | POA: Diagnosis not present

## 2023-04-17 DIAGNOSIS — E7800 Pure hypercholesterolemia, unspecified: Secondary | ICD-10-CM | POA: Diagnosis not present

## 2023-04-17 DIAGNOSIS — E039 Hypothyroidism, unspecified: Secondary | ICD-10-CM | POA: Diagnosis not present

## 2023-04-21 DIAGNOSIS — R8290 Unspecified abnormal findings in urine: Secondary | ICD-10-CM | POA: Diagnosis not present

## 2023-04-29 DIAGNOSIS — E875 Hyperkalemia: Secondary | ICD-10-CM | POA: Diagnosis not present

## 2023-04-29 DIAGNOSIS — R030 Elevated blood-pressure reading, without diagnosis of hypertension: Secondary | ICD-10-CM | POA: Diagnosis not present

## 2023-04-30 DIAGNOSIS — L308 Other specified dermatitis: Secondary | ICD-10-CM | POA: Diagnosis not present

## 2023-04-30 DIAGNOSIS — L718 Other rosacea: Secondary | ICD-10-CM | POA: Diagnosis not present

## 2023-07-11 DIAGNOSIS — L308 Other specified dermatitis: Secondary | ICD-10-CM | POA: Diagnosis not present

## 2023-07-11 DIAGNOSIS — I788 Other diseases of capillaries: Secondary | ICD-10-CM | POA: Diagnosis not present

## 2023-07-11 DIAGNOSIS — L814 Other melanin hyperpigmentation: Secondary | ICD-10-CM | POA: Diagnosis not present

## 2023-07-11 DIAGNOSIS — D1801 Hemangioma of skin and subcutaneous tissue: Secondary | ICD-10-CM | POA: Diagnosis not present

## 2023-07-11 DIAGNOSIS — L111 Transient acantholytic dermatosis [Grover]: Secondary | ICD-10-CM | POA: Diagnosis not present

## 2023-07-11 DIAGNOSIS — L700 Acne vulgaris: Secondary | ICD-10-CM | POA: Diagnosis not present

## 2023-07-11 DIAGNOSIS — L821 Other seborrheic keratosis: Secondary | ICD-10-CM | POA: Diagnosis not present

## 2023-07-21 ENCOUNTER — Other Ambulatory Visit: Payer: Self-pay | Admitting: Family Medicine

## 2023-07-21 DIAGNOSIS — Z1231 Encounter for screening mammogram for malignant neoplasm of breast: Secondary | ICD-10-CM

## 2023-08-18 DIAGNOSIS — Z4689 Encounter for fitting and adjustment of other specified devices: Secondary | ICD-10-CM | POA: Diagnosis not present

## 2023-08-20 DIAGNOSIS — L578 Other skin changes due to chronic exposure to nonionizing radiation: Secondary | ICD-10-CM | POA: Diagnosis not present

## 2023-08-20 DIAGNOSIS — L570 Actinic keratosis: Secondary | ICD-10-CM | POA: Diagnosis not present

## 2023-10-10 ENCOUNTER — Ambulatory Visit

## 2023-10-15 ENCOUNTER — Ambulatory Visit
Admission: RE | Admit: 2023-10-15 | Discharge: 2023-10-15 | Disposition: A | Discharge status: Home / Self Care | Source: Ambulatory Visit | Attending: Family Medicine | Admitting: Family Medicine

## 2023-10-15 DIAGNOSIS — Z1231 Encounter for screening mammogram for malignant neoplasm of breast: Secondary | ICD-10-CM | POA: Diagnosis not present

## 2023-11-20 DIAGNOSIS — Z4689 Encounter for fitting and adjustment of other specified devices: Secondary | ICD-10-CM | POA: Diagnosis not present

## 2023-11-20 DIAGNOSIS — R030 Elevated blood-pressure reading, without diagnosis of hypertension: Secondary | ICD-10-CM | POA: Diagnosis not present

## 2023-11-26 DIAGNOSIS — K640 First degree hemorrhoids: Secondary | ICD-10-CM | POA: Diagnosis not present

## 2023-11-26 DIAGNOSIS — D122 Benign neoplasm of ascending colon: Secondary | ICD-10-CM | POA: Diagnosis not present

## 2023-11-26 DIAGNOSIS — D123 Benign neoplasm of transverse colon: Secondary | ICD-10-CM | POA: Diagnosis not present

## 2023-11-26 DIAGNOSIS — Z09 Encounter for follow-up examination after completed treatment for conditions other than malignant neoplasm: Secondary | ICD-10-CM | POA: Diagnosis not present

## 2023-11-26 DIAGNOSIS — Z860101 Personal history of adenomatous and serrated colon polyps: Secondary | ICD-10-CM | POA: Diagnosis not present
